# Patient Record
Sex: Female | Born: 1940 | Race: White | Hispanic: No | Smoking: Current every day smoker
Health system: Southern US, Community
[De-identification: ages and names within clinical notes are randomized; demographics above are authoritative.]

## PROBLEM LIST (undated history)

## (undated) DIAGNOSIS — K259 Gastric ulcer, unspecified as acute or chronic, without hemorrhage or perforation: Secondary | ICD-10-CM

## (undated) DIAGNOSIS — E079 Disorder of thyroid, unspecified: Secondary | ICD-10-CM

## (undated) DIAGNOSIS — I10 Essential (primary) hypertension: Secondary | ICD-10-CM

---

## 2004-12-25 ENCOUNTER — Ambulatory Visit: Payer: Self-pay | Admitting: Surgery

## 2005-07-21 ENCOUNTER — Emergency Department: Payer: Self-pay | Admitting: Emergency Medicine

## 2008-11-25 ENCOUNTER — Emergency Department: Payer: Self-pay | Admitting: Emergency Medicine

## 2009-06-15 ENCOUNTER — Emergency Department: Payer: Self-pay | Admitting: Emergency Medicine

## 2019-12-01 ENCOUNTER — Emergency Department: Payer: Medicare Other

## 2019-12-01 ENCOUNTER — Emergency Department
Admission: EM | Admit: 2019-12-01 | Discharge: 2019-12-01 | Disposition: A | Discharge status: Home / Self Care | Payer: Medicare Other | Attending: Emergency Medicine | Admitting: Emergency Medicine

## 2019-12-01 ENCOUNTER — Other Ambulatory Visit: Payer: Self-pay

## 2019-12-01 DIAGNOSIS — F17210 Nicotine dependence, cigarettes, uncomplicated: Secondary | ICD-10-CM | POA: Insufficient documentation

## 2019-12-01 DIAGNOSIS — I10 Essential (primary) hypertension: Secondary | ICD-10-CM | POA: Diagnosis not present

## 2019-12-01 DIAGNOSIS — K5792 Diverticulitis of intestine, part unspecified, without perforation or abscess without bleeding: Secondary | ICD-10-CM | POA: Insufficient documentation

## 2019-12-01 DIAGNOSIS — R1030 Lower abdominal pain, unspecified: Secondary | ICD-10-CM | POA: Diagnosis present

## 2019-12-01 HISTORY — DX: Gastric ulcer, unspecified as acute or chronic, without hemorrhage or perforation: K25.9

## 2019-12-01 HISTORY — DX: Disorder of thyroid, unspecified: E07.9

## 2019-12-01 HISTORY — DX: Essential (primary) hypertension: I10

## 2019-12-01 LAB — COMPREHENSIVE METABOLIC PANEL
ALT: 20 U/L (ref 0–44)
AST: 26 U/L (ref 15–41)
Albumin: 3.5 g/dL (ref 3.5–5.0)
Alkaline Phosphatase: 125 U/L (ref 38–126)
Anion gap: 10 (ref 5–15)
BUN: 21 mg/dL (ref 8–23)
CO2: 24 mmol/L (ref 22–32)
Calcium: 9.1 mg/dL (ref 8.9–10.3)
Chloride: 105 mmol/L (ref 98–111)
Creatinine, Ser: 1.5 mg/dL — ABNORMAL HIGH (ref 0.44–1.00)
GFR calc Af Amer: 39 mL/min — ABNORMAL LOW (ref >60)
GFR calc non Af Amer: 33 mL/min — ABNORMAL LOW (ref >60)
Glucose, Bld: 117 mg/dL — ABNORMAL HIGH (ref 70–99)
Potassium: 3.4 mmol/L — ABNORMAL LOW (ref 3.5–5.1)
Sodium: 139 mmol/L (ref 135–145)
Total Bilirubin: 0.5 mg/dL (ref 0.3–1.2)
Total Protein: 7.6 g/dL (ref 6.5–8.1)

## 2019-12-01 LAB — URINALYSIS, COMPLETE (UACMP) WITH MICROSCOPIC
Bacteria, UA: NONE SEEN
Bilirubin Urine: NEGATIVE
Glucose, UA: NEGATIVE mg/dL
Hgb urine dipstick: NEGATIVE
Ketones, ur: NEGATIVE mg/dL
Leukocytes,Ua: NEGATIVE
Nitrite: NEGATIVE
Protein, ur: 30 mg/dL — AB
Specific Gravity, Urine: 1.009 (ref 1.005–1.030)
pH: 6 (ref 5.0–8.0)

## 2019-12-01 LAB — CBC
HCT: 31.9 % — ABNORMAL LOW (ref 36.0–46.0)
Hemoglobin: 9.9 g/dL — ABNORMAL LOW (ref 12.0–15.0)
MCH: 25.3 pg — ABNORMAL LOW (ref 26.0–34.0)
MCHC: 31 g/dL (ref 30.0–36.0)
MCV: 81.6 fL (ref 80.0–100.0)
Platelets: 327 10*3/uL (ref 150–400)
RBC: 3.91 MIL/uL (ref 3.87–5.11)
RDW: 16.3 % — ABNORMAL HIGH (ref 11.5–15.5)
WBC: 16.7 10*3/uL — ABNORMAL HIGH (ref 4.0–10.5)
nRBC: 0 % (ref 0.0–0.2)

## 2019-12-01 LAB — TROPONIN I (HIGH SENSITIVITY): Troponin I (High Sensitivity): 10 ng/L (ref <18)

## 2019-12-01 LAB — LIPASE, BLOOD: Lipase: 41 U/L (ref 11–51)

## 2019-12-01 MED ORDER — POLYETHYLENE GLYCOL 3350 17 G PO PACK: 17.0000 g | PACK | Freq: Every day | ORAL | 0 refills | Status: AC

## 2019-12-01 MED ORDER — SODIUM CHLORIDE 0.9% FLUSH: 3.0000 mL | Freq: Once | INTRAVENOUS | Status: DC

## 2019-12-01 MED ORDER — TRAMADOL HCL 50 MG PO TABS: 50.0000 mg | ORAL_TABLET | Freq: Four times a day (QID) | ORAL | 0 refills | Status: AC | PRN

## 2019-12-01 MED ORDER — METRONIDAZOLE 500 MG PO TABS
500.0000 mg | ORAL_TABLET | Freq: Once | ORAL | Status: AC
  Administered 2019-12-01: 500 mg via ORAL
  Filled 2019-12-01: qty 1

## 2019-12-01 MED ORDER — LEVOFLOXACIN 750 MG PO TABS: 750.0000 mg | ORAL_TABLET | ORAL | 0 refills | Status: AC

## 2019-12-01 MED ORDER — TRAMADOL HCL 50 MG PO TABS
50.0000 mg | ORAL_TABLET | Freq: Once | ORAL | Status: AC
  Administered 2019-12-01: 50 mg via ORAL
  Filled 2019-12-01: qty 1

## 2019-12-01 MED ORDER — METRONIDAZOLE 500 MG PO TABS: 500.0000 mg | ORAL_TABLET | Freq: Two times a day (BID) | ORAL | 0 refills | Status: AC

## 2019-12-01 MED ORDER — IOHEXOL 300 MG/ML  SOLN: 75.0000 mL | Freq: Once | INTRAMUSCULAR | Status: DC | PRN

## 2019-12-01 MED ORDER — LEVOFLOXACIN 750 MG PO TABS
750.0000 mg | ORAL_TABLET | Freq: Once | ORAL | Status: AC
  Administered 2019-12-01: 750 mg via ORAL
  Filled 2019-12-01: qty 1

## 2019-12-01 NOTE — ED Notes (Signed)
Pt transported to CT ?

## 2019-12-01 NOTE — ED Triage Notes (Signed)
Pt to the er via ems for abd pain and bloating r/t constipation. Pt has been unable to have a BM in 3 days. Vitals with EMS were 164/86, 86, 97% on room air. Hx of gastric ulcer but not a blockage. Pt has tried 3 suppositories at home with no relief.

## 2019-12-01 NOTE — ED Provider Notes (Signed)
[facility] Emergency Department Provider Note   ____________________________________________    I have reviewed the triage vital signs and the nursing notes.   HISTORY  Chief Complaint Abdominal Pain     HPI Holly Brooks is a 78 y.o. female who presents with complaints of abdominal pain.  Patient reports symptoms developed yesterday evening.  She thought perhaps she was constipated and has tried to have bowel movements with little success and little improvement.  Some nausea no vomiting.  No history of abdominal surgery.  Has not taken anything for this.  Denies fevers or chills.  Denies dysuria.  Abdominal pain is in the lower abdomen and is mild to moderate   Past Medical History:  Diagnosis Date  . Gastric ulcer   . Hypertension   . Thyroid disease     There are no problems to display for this patient.     Prior to Admission medications   Medication Sig Start Date End Date Taking? Authorizing Provider  levofloxacin (LEVAQUIN) 750 MG tablet Take 1 tablet (750 mg total) by mouth every other day for 7 days. 12/03/19 12/10/19  Lavonia Drafts, MD  metroNIDAZOLE (FLAGYL) 500 MG tablet Take 1 tablet (500 mg total) by mouth 2 (two) times daily after a meal. 12/01/19   Lavonia Drafts, MD  polyethylene glycol (MIRALAX) 17 g packet Take 17 g by mouth daily. 12/01/19   Lavonia Drafts, MD  traMADol (ULTRAM) 50 MG tablet Take 1 tablet (50 mg total) by mouth every 6 (six) hours as needed. 12/01/19 11/30/20  Lavonia Drafts, MD     Allergies Patient has no allergy information on record.  No family history on file.  Social History Social History   Tobacco Use  . Smoking status: Current Every Day Smoker  . Smokeless tobacco: Never Used  Substance Use Topics  . Alcohol use: Never  . Drug use: Never    Review of Systems  Constitutional: No fever/chills Eyes: No visual changes.  ENT: No sore throat. Cardiovascular: Denies chest  pain. Respiratory: Denies shortness of breath. Gastrointestinal: As above Genitourinary: Negative for dysuria.  Some frequency Musculoskeletal: Negative for back pain. Skin: Negative for rash. Neurological: Negative for headaches    ____________________________________________   PHYSICAL EXAM:  VITAL SIGNS: ED Triage Vitals  Enc Vitals Group     BP 12/01/19 0533 (!) 154/58     Pulse Rate 12/01/19 0533 79     Resp 12/01/19 0533 18     Temp 12/01/19 0533 (!) 97.5 F (36.4 C)     Temp Source 12/01/19 0533 Oral     SpO2 12/01/19 0533 99 %     Weight 12/01/19 0534 52.2 kg (115 lb)     Height 12/01/19 0534 1.575 m (5\' 2" )     Head Circumference --      Peak Flow --      Pain Score 12/01/19 0534 7     Pain Loc --      Pain Edu? --      Excl. in [place]? --     Constitutional: Alert and oriented.   Nose: No congestion/rhinnorhea. Mouth/Throat: Mucous membranes are moist.    Cardiovascular: Normal rate, regular rhythm. Grossly normal heart sounds.  Good peripheral circulation. Respiratory: Normal respiratory effort.  No retractions. Lungs CTAB. Gastrointestinal: Mild tenderness palpation the left lower quadrant and suprapubically, no distention, no CVA tenderness  Musculoskeletal:  Warm and well perfused Neurologic:  Normal speech and language. No gross focal neurologic deficits  are appreciated.  Skin:  Skin is warm, dry and intact. No rash noted. Psychiatric: Mood and affect are normal. Speech and behavior are normal.  ____________________________________________   LABS (all labs ordered are listed, but only abnormal results are displayed)  Labs Reviewed  COMPREHENSIVE METABOLIC PANEL - Abnormal; Notable for the following components:      Result Value   Potassium 3.4 (*)    Glucose, Bld 117 (*)    Creatinine, Ser 1.50 (*)    GFR calc non Af Amer 33 (*)    GFR calc Af Amer 39 (*)    All other components within normal limits  CBC - Abnormal; Notable for the following  components:   WBC 16.7 (*)    Hemoglobin 9.9 (*)    HCT 31.9 (*)    MCH 25.3 (*)    RDW 16.3 (*)    All other components within normal limits  URINALYSIS, COMPLETE (UACMP) WITH MICROSCOPIC - Abnormal; Notable for the following components:   Color, Urine YELLOW (*)    APPearance CLEAR (*)    Protein, ur 30 (*)    All other components within normal limits  LIPASE, BLOOD  TROPONIN I (HIGH SENSITIVITY)   ____________________________________________  EKG  ED ECG REPORT I, Jene Every, the attending physician, personally viewed and interpreted this ECG.  Date: 12/01/2019  Rhythm: normal sinus rhythm QRS Axis: normal Intervals: normal ST/T Wave abnormalities: normal Narrative Interpretation: no evidence of acute ischemia  ____________________________________________  RADIOLOGY  CT abdomen pelvis ____________________________________________   PROCEDURES  Procedure(s) performed: No  Procedures   Critical Care performed: No ____________________________________________   INITIAL IMPRESSION / ASSESSMENT AND PLAN / ED COURSE  Pertinent labs & imaging results that were available during my care of the patient were reviewed by me and considered in my medical decision making (see chart for details).  Patient presents with primarily left lower quadrant and lower abdominal pain in the setting of constipation, mild to moderate tenderness to palpation.  Lab work significant for elevated white blood cell count.  KUB unremarkable, suspicious for diverticulitis, will obtain CT abdomen pelvis  CT abdomen pelvis suspicious for likely early diverticulitis, will start the patient on antibiotics, creatinine/kidney function accounted for in dosing.  Possible pneumonia seen on CT as well however patient without hypoxia will use Levaquin for diverticulitis and possible respiratory infection.  Return precautions discussed, outpatient follow-up, colonoscopy required      ____________________________________________   FINAL CLINICAL IMPRESSION(S) / ED DIAGNOSES  Final diagnoses:  Diverticulitis        Note:  This document was prepared using Dragon voice recognition software and may include unintentional dictation errors.   Jene Every, MD 12/01/19 1339

## 2019-12-01 NOTE — ED Notes (Signed)
Dr Kinner at bedside. 

## 2020-07-27 ENCOUNTER — Inpatient Hospital Stay: Payer: Medicare Other | Admitting: Oncology

## 2020-07-27 ENCOUNTER — Inpatient Hospital Stay: Payer: Medicare Other

## 2020-07-30 ENCOUNTER — Other Ambulatory Visit: Payer: Self-pay | Admitting: Infectious Diseases

## 2020-07-30 DIAGNOSIS — R413 Other amnesia: Secondary | ICD-10-CM

## 2020-07-30 DIAGNOSIS — R270 Ataxia, unspecified: Secondary | ICD-10-CM

## 2020-08-01 ENCOUNTER — Other Ambulatory Visit: Payer: Self-pay

## 2020-08-01 DIAGNOSIS — Z5321 Procedure and treatment not carried out due to patient leaving prior to being seen by health care provider: Secondary | ICD-10-CM | POA: Diagnosis not present

## 2020-08-01 DIAGNOSIS — R109 Unspecified abdominal pain: Secondary | ICD-10-CM | POA: Diagnosis not present

## 2020-08-01 LAB — COMPREHENSIVE METABOLIC PANEL
ALT: 12 U/L (ref 0–44)
AST: 16 U/L (ref 15–41)
Albumin: 3.5 g/dL (ref 3.5–5.0)
Alkaline Phosphatase: 79 U/L (ref 38–126)
Anion gap: 13 (ref 5–15)
BUN: 24 mg/dL — ABNORMAL HIGH (ref 8–23)
CO2: 23 mmol/L (ref 22–32)
Calcium: 9.2 mg/dL (ref 8.9–10.3)
Chloride: 90 mmol/L — ABNORMAL LOW (ref 98–111)
Creatinine, Ser: 1.4 mg/dL — ABNORMAL HIGH (ref 0.44–1.00)
GFR calc Af Amer: 42 mL/min — ABNORMAL LOW (ref >60)
GFR calc non Af Amer: 36 mL/min — ABNORMAL LOW (ref >60)
Glucose, Bld: 96 mg/dL (ref 70–99)
Potassium: 4 mmol/L (ref 3.5–5.1)
Sodium: 126 mmol/L — ABNORMAL LOW (ref 135–145)
Total Bilirubin: 0.5 mg/dL (ref 0.3–1.2)
Total Protein: 7.2 g/dL (ref 6.5–8.1)

## 2020-08-01 LAB — CBC
HCT: 26.8 % — ABNORMAL LOW (ref 36.0–46.0)
Hemoglobin: 9.1 g/dL — ABNORMAL LOW (ref 12.0–15.0)
MCH: 27.6 pg (ref 26.0–34.0)
MCHC: 34 g/dL (ref 30.0–36.0)
MCV: 81.2 fL (ref 80.0–100.0)
Platelets: 308 10*3/uL (ref 150–400)
RBC: 3.3 MIL/uL — ABNORMAL LOW (ref 3.87–5.11)
RDW: 13 % (ref 11.5–15.5)
WBC: 11.5 10*3/uL — ABNORMAL HIGH (ref 4.0–10.5)
nRBC: 0 % (ref 0.0–0.2)

## 2020-08-01 LAB — URINALYSIS, COMPLETE (UACMP) WITH MICROSCOPIC
Bacteria, UA: NONE SEEN
Bilirubin Urine: NEGATIVE
Glucose, UA: NEGATIVE mg/dL
Hgb urine dipstick: NEGATIVE
Ketones, ur: NEGATIVE mg/dL
Leukocytes,Ua: NEGATIVE
Nitrite: NEGATIVE
Protein, ur: NEGATIVE mg/dL
Specific Gravity, Urine: 1.003 — ABNORMAL LOW (ref 1.005–1.030)
pH: 7 (ref 5.0–8.0)

## 2020-08-01 LAB — LIPASE, BLOOD: Lipase: 67 U/L — ABNORMAL HIGH (ref 11–51)

## 2020-08-01 NOTE — ED Triage Notes (Signed)
Pt comes for abd pain for a few days. Hx of diverticulitis. States pain is the same.

## 2020-08-02 ENCOUNTER — Emergency Department
Admission: EM | Admit: 2020-08-02 | Discharge: 2020-08-02 | Disposition: A | Discharge status: Home / Self Care | Payer: Medicare Other | Attending: Emergency Medicine | Admitting: Emergency Medicine

## 2020-08-03 ENCOUNTER — Emergency Department
Admission: EM | Admit: 2020-08-03 | Discharge: 2020-08-03 | Disposition: A | Discharge status: Home / Self Care | Payer: Medicare Other | Attending: Emergency Medicine | Admitting: Emergency Medicine

## 2020-08-03 ENCOUNTER — Other Ambulatory Visit: Payer: Self-pay

## 2020-08-03 ENCOUNTER — Emergency Department: Payer: Medicare Other

## 2020-08-03 DIAGNOSIS — Y998 Other external cause status: Secondary | ICD-10-CM | POA: Insufficient documentation

## 2020-08-03 DIAGNOSIS — X58XXXA Exposure to other specified factors, initial encounter: Secondary | ICD-10-CM | POA: Diagnosis not present

## 2020-08-03 DIAGNOSIS — Y929 Unspecified place or not applicable: Secondary | ICD-10-CM | POA: Diagnosis not present

## 2020-08-03 DIAGNOSIS — S0990XA Unspecified injury of head, initial encounter: Secondary | ICD-10-CM | POA: Diagnosis present

## 2020-08-03 DIAGNOSIS — Y939 Activity, unspecified: Secondary | ICD-10-CM | POA: Insufficient documentation

## 2020-08-03 DIAGNOSIS — Z5321 Procedure and treatment not carried out due to patient leaving prior to being seen by health care provider: Secondary | ICD-10-CM | POA: Diagnosis not present

## 2020-08-03 DIAGNOSIS — S0101XA Laceration without foreign body of scalp, initial encounter: Secondary | ICD-10-CM | POA: Diagnosis not present

## 2020-08-03 LAB — BASIC METABOLIC PANEL
Anion gap: 12 (ref 5–15)
BUN: 23 mg/dL (ref 8–23)
CO2: 21 mmol/L — ABNORMAL LOW (ref 22–32)
Calcium: 8.2 mg/dL — ABNORMAL LOW (ref 8.9–10.3)
Chloride: 89 mmol/L — ABNORMAL LOW (ref 98–111)
Creatinine, Ser: 1.43 mg/dL — ABNORMAL HIGH (ref 0.44–1.00)
GFR calc Af Amer: 41 mL/min — ABNORMAL LOW (ref >60)
GFR calc non Af Amer: 35 mL/min — ABNORMAL LOW (ref >60)
Glucose, Bld: 145 mg/dL — ABNORMAL HIGH (ref 70–99)
Potassium: 2.8 mmol/L — ABNORMAL LOW (ref 3.5–5.1)
Sodium: 122 mmol/L — ABNORMAL LOW (ref 135–145)

## 2020-08-03 LAB — CBC
HCT: 24.5 % — ABNORMAL LOW (ref 36.0–46.0)
Hemoglobin: 8.8 g/dL — ABNORMAL LOW (ref 12.0–15.0)
MCH: 27.8 pg (ref 26.0–34.0)
MCHC: 35.9 g/dL (ref 30.0–36.0)
MCV: 77.3 fL — ABNORMAL LOW (ref 80.0–100.0)
Platelets: 340 10*3/uL (ref 150–400)
RBC: 3.17 MIL/uL — ABNORMAL LOW (ref 3.87–5.11)
RDW: 13 % (ref 11.5–15.5)
WBC: 13.8 10*3/uL — ABNORMAL HIGH (ref 4.0–10.5)
nRBC: 0 % (ref 0.0–0.2)

## 2020-08-03 NOTE — ED Triage Notes (Signed)
Pt to ED via EMS for fall this AM. Reports hitting head, denies blood thinner use.  Knot noted to back of head with laceration, bleeding controlled at this time.  Pt in ccollar with Ems. Denies neck pain  Clear speech

## 2022-04-05 IMAGING — CT CT CERVICAL SPINE W/O CM
3 of 4 series · 12 of 33 positions shown, 14 images · non-contrast
Comparison: July 21, 2005.

CLINICAL DATA: Head laceration after fall.

EXAM:
CT HEAD WITHOUT CONTRAST
CT CERVICAL SPINE WITHOUT CONTRAST
TECHNIQUE: Multidetector CT imaging of the head and cervical spine was
performed following the standard protocol without intravenous
contrast. Multiplanar CT image reconstructions of the cervical spine
were also generated.

[Series 6: sagittal bone · sagittal · 0.21mm/px · 5 of 54 slices shown, 6 images]
[im 18/54  bone]
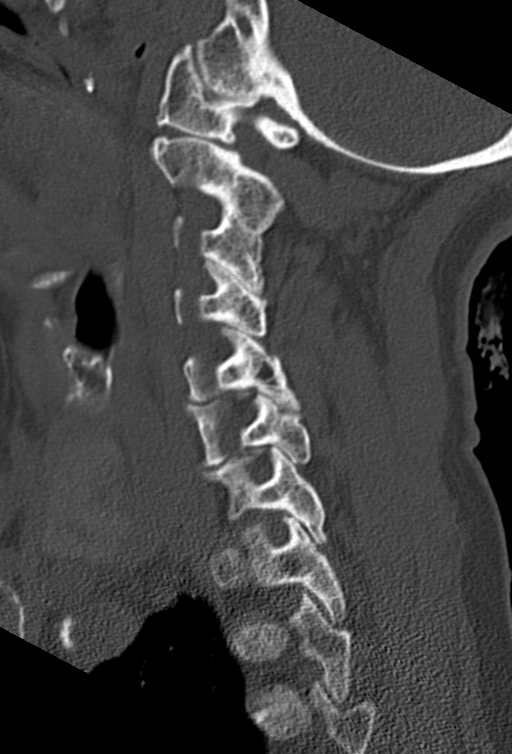
[im 23/54  bone]
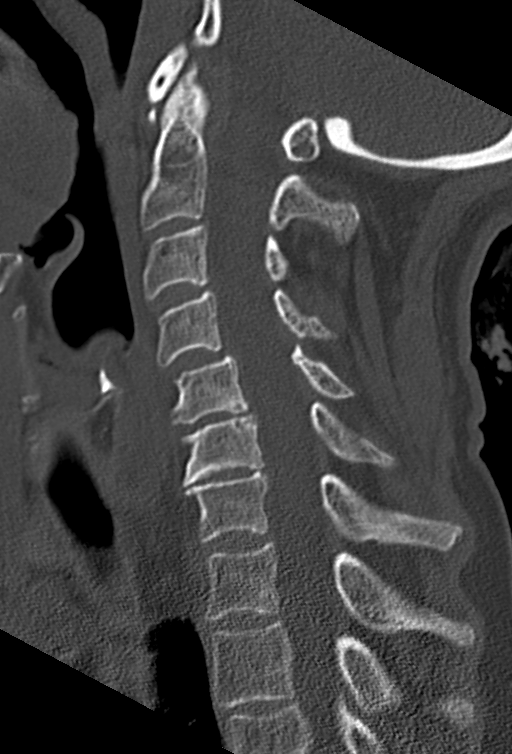
[im 27/54  soft-tissue]
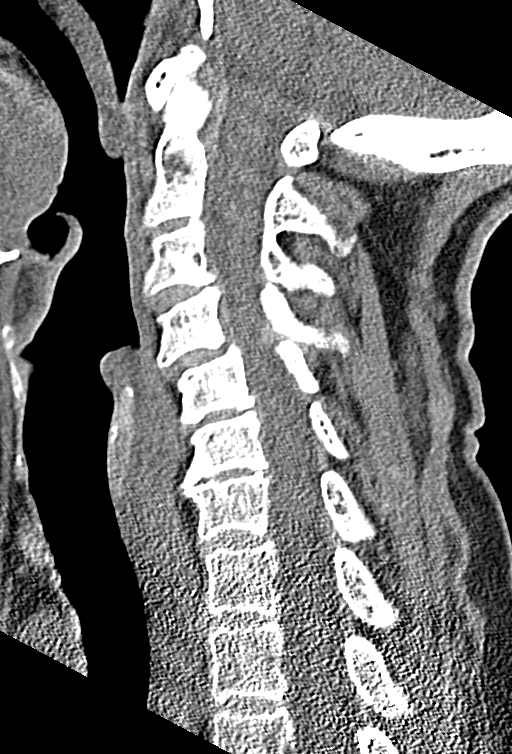
[im 27/54  bone]
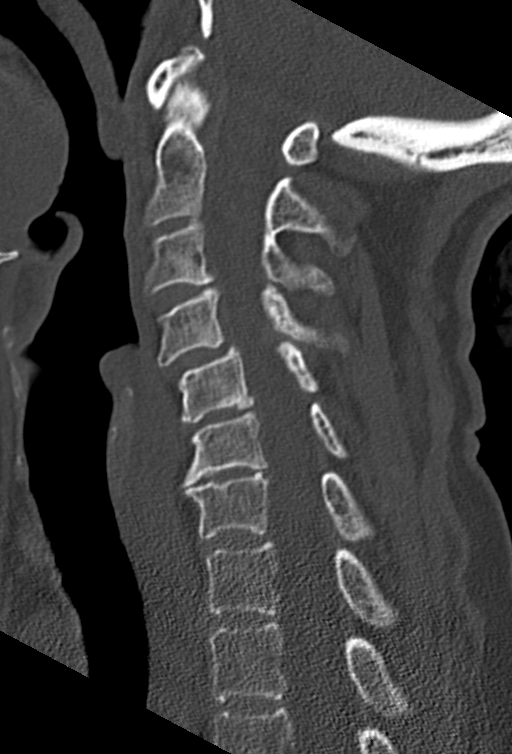
[im 31/54  bone]
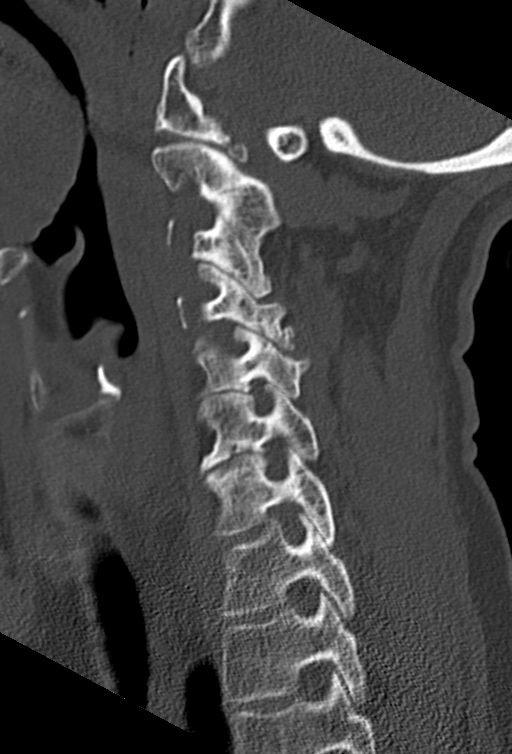
[im 36/54  bone]
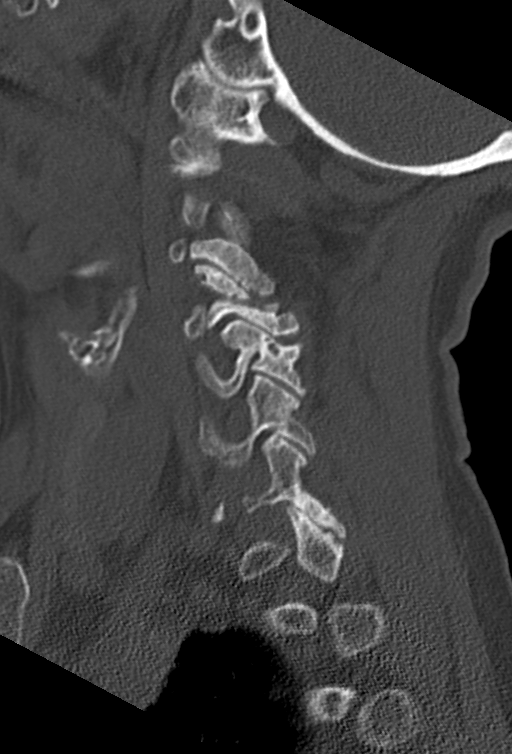

[Series 7: coronal bone · coronal · 0.21mm/px · 3 of 55 slices shown]
[im 12/55  bone]
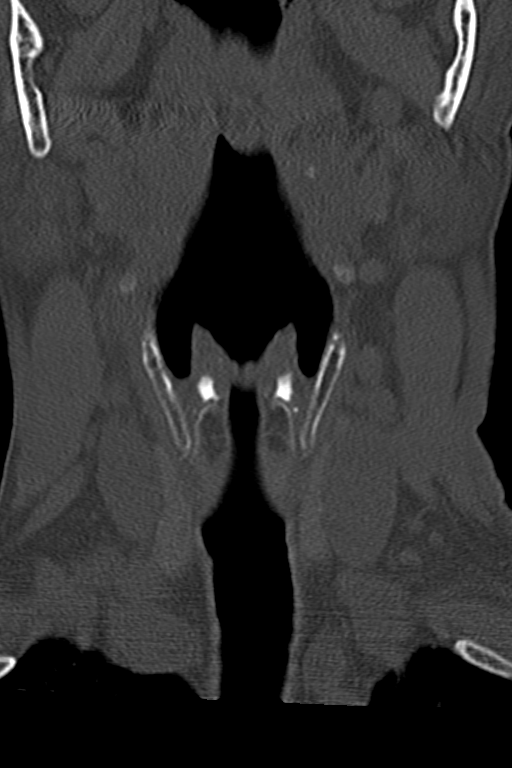
[im 22/55  bone]
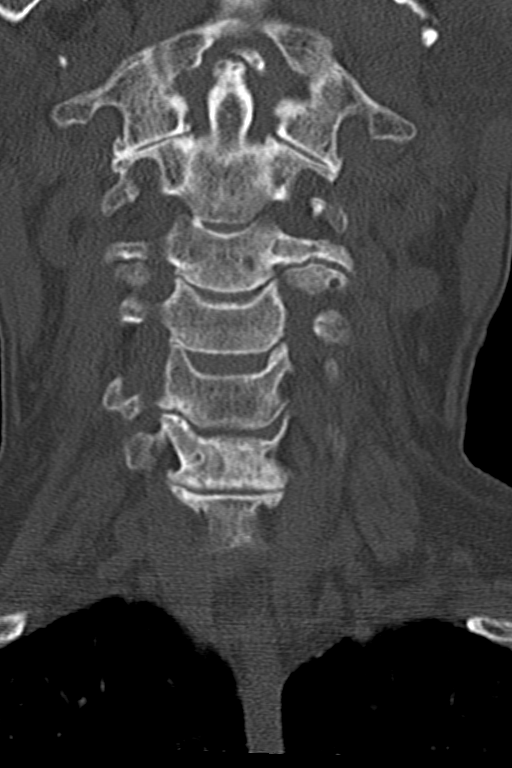
[im 33/55  bone]
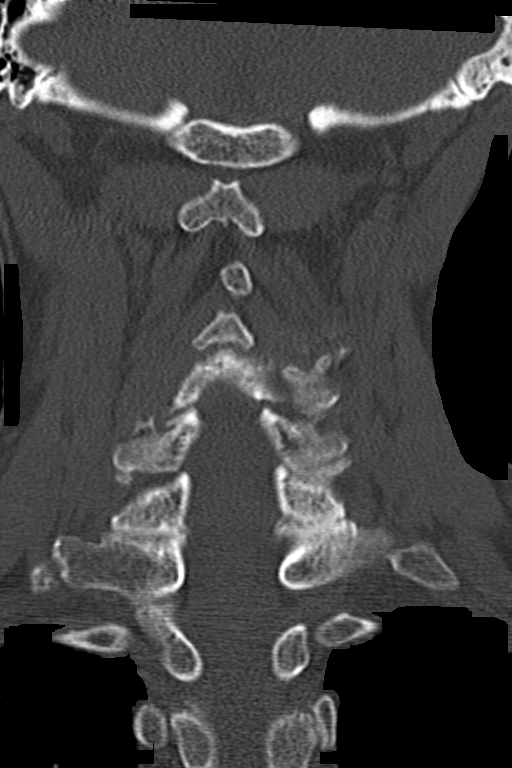

[Series 8: orthogonal bone · axial · 0.21mm/px · z∈[+198,+290]mm · 4 of 80 slices shown, 5 images]
[im 14/80  soft-tissue]
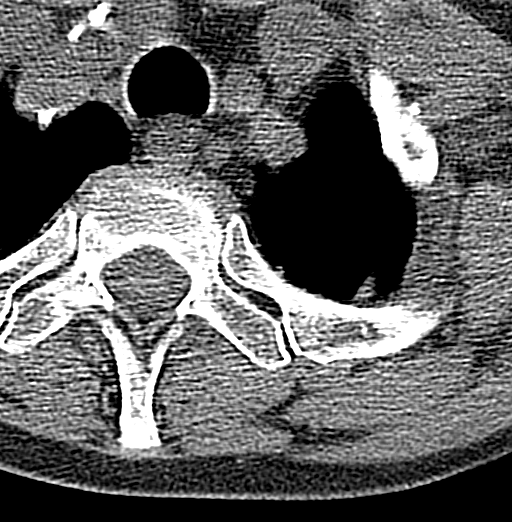
[im 14/80  bone]
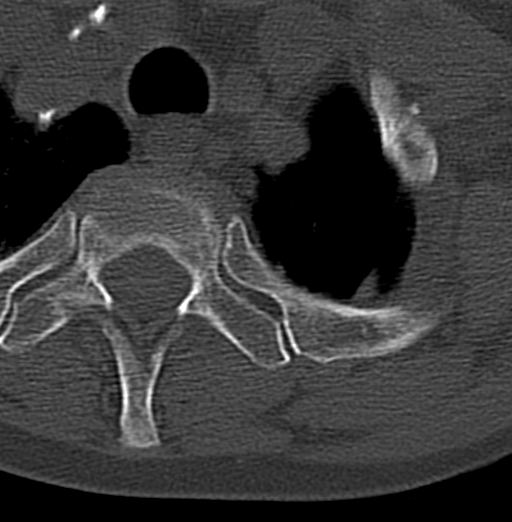
[im 27/80  bone]
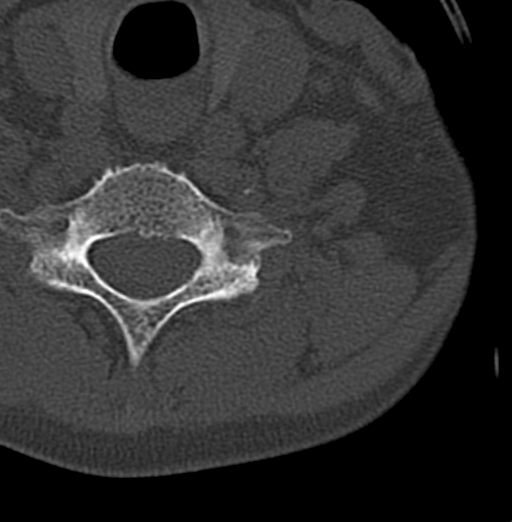
[im 53/80  bone]
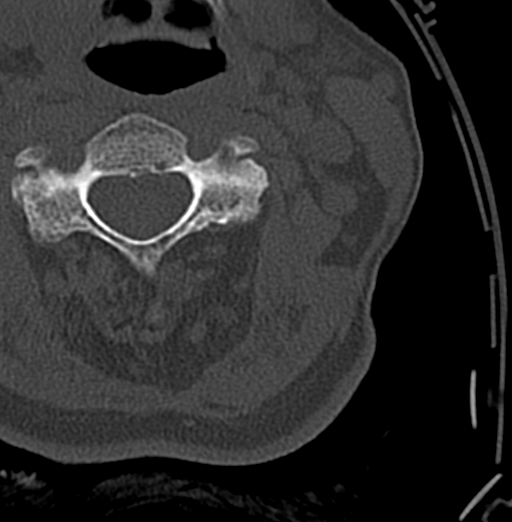
[im 66/80  bone]
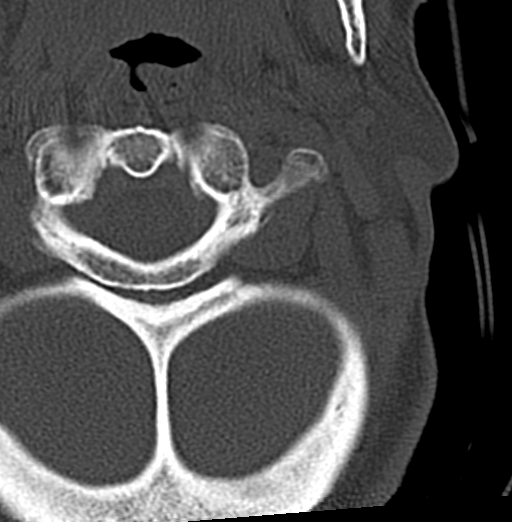

[12 of 33 positions shown; findings below may reference images not displayed]

FINDINGS: CT HEAD FINDINGS

Brain: Mild chronic ischemic white matter disease is noted. No mass
effect or midline shift is noted. Ventricular size is within normal
limits. There is no evidence of mass lesion, hemorrhage or acute
infarction.

Vascular: No hyperdense vessel or unexpected calcification.

Skull: Normal. Negative for fracture or focal lesion.

Sinuses/Orbits: No acute finding.

Other: Small scalp hematoma is seen posteriorly.

CT CERVICAL SPINE FINDINGS

Alignment: Grade 1 anterolisthesis of C4-5 is noted secondary to
posterior facet joint hypertrophy. Minimal grade 1 anterolisthesis
is noted at C5-6 secondary to posterior facet joint hypertrophy.

Skull base and vertebrae: No acute fracture. No primary bone lesion
or focal pathologic process.

Soft tissues and spinal canal: No prevertebral fluid or swelling. No
visible canal hematoma.

Disc levels: Moderate degenerative disc disease is noted at C5-6 and
C6-7.

Upper chest: Negative.

Other: Degenerative changes are seen involving the posterior facet
joints bilaterally.
IMPRESSION: 1. Mild chronic ischemic white matter disease. Small scalp hematoma
posteriorly. No acute intracranial abnormality seen.
2. Multilevel degenerative disc disease. No acute abnormality seen
in the cervical spine.

## 2022-04-05 IMAGING — CT CT HEAD W/O CM
3 series · 15 of 45 positions shown, 18 images · non-contrast
Comparison: July 21, 2005.

CLINICAL DATA: Head laceration after fall.

EXAM:
CT HEAD WITHOUT CONTRAST
CT CERVICAL SPINE WITHOUT CONTRAST
TECHNIQUE: Multidetector CT imaging of the head and cervical spine was
performed following the standard protocol without intravenous
contrast. Multiplanar CT image reconstructions of the cervical spine
were also generated.

[Series 2: head wo · axial · 0.39mm/px · z∈[+345,+460]mm · 9 of 28 slices shown, 12 images]
[im 3/28  brain]
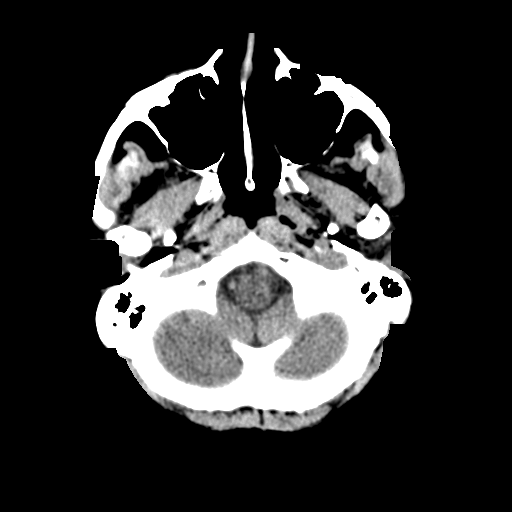
[im 3/28  bone]
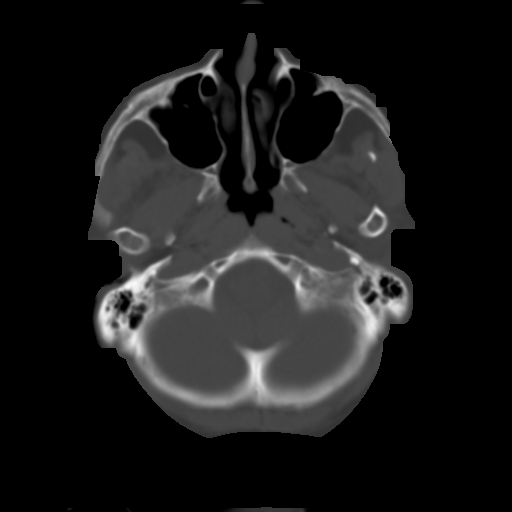
[im 6/28  brain]
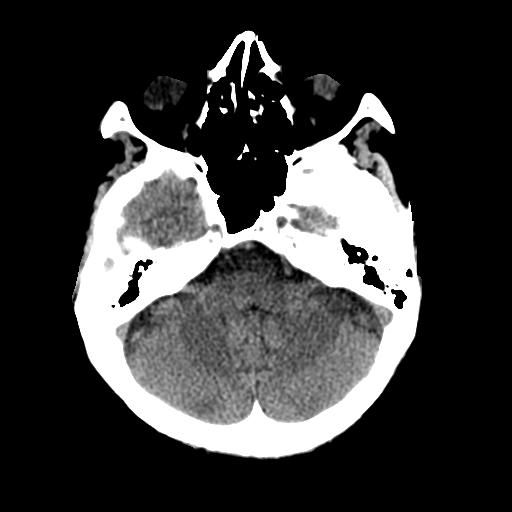
[im 9/28  brain]
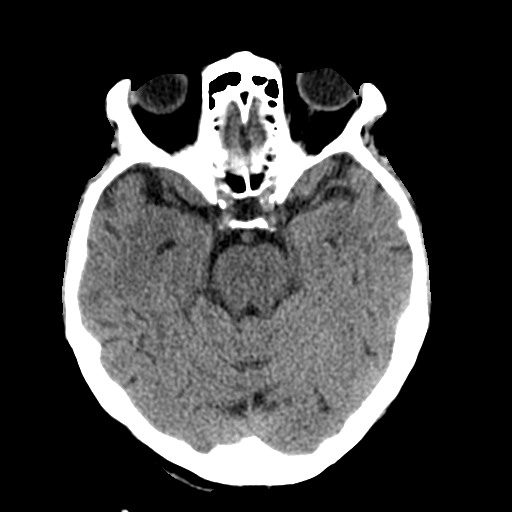
[im 12/28  brain]
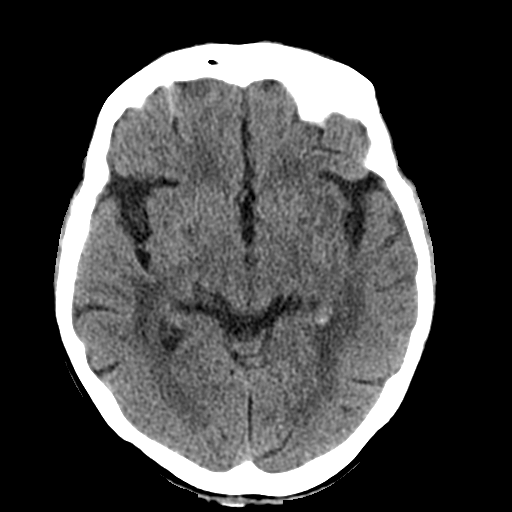
[im 15/28  brain]
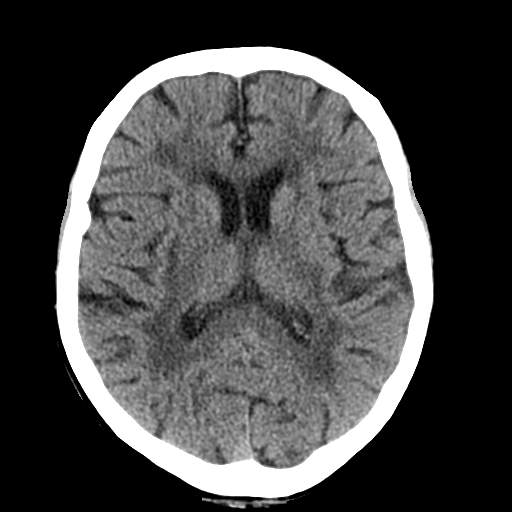
[im 15/28  bone]
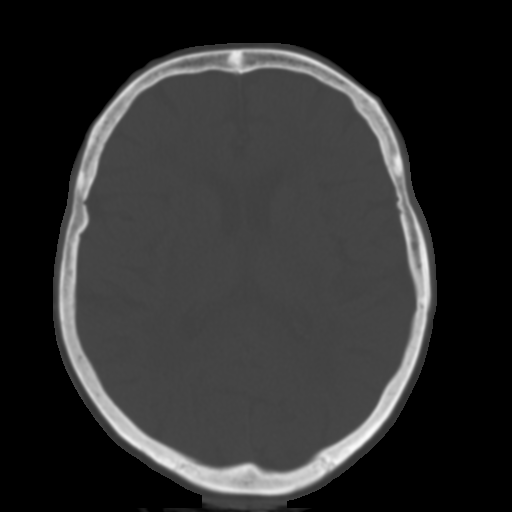
[im 17/28  brain]
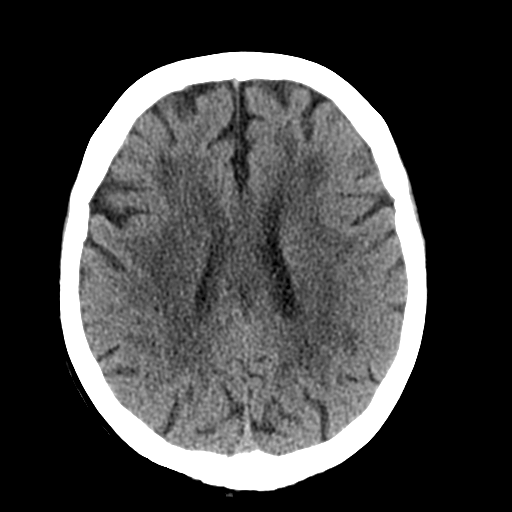
[im 20/28  brain]
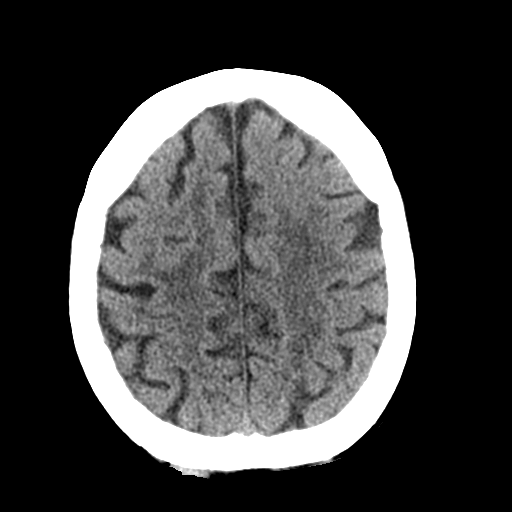
[im 23/28  brain]
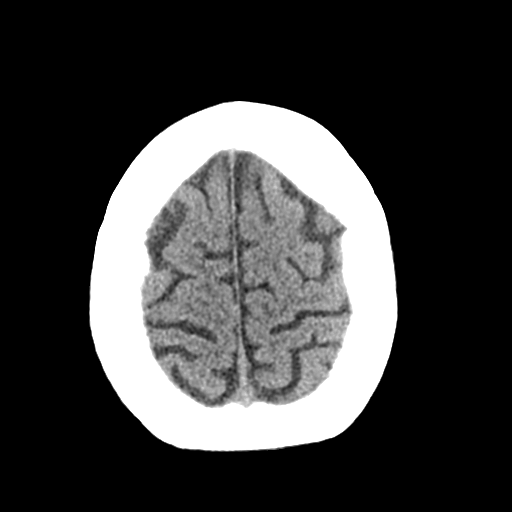
[im 26/28  brain]
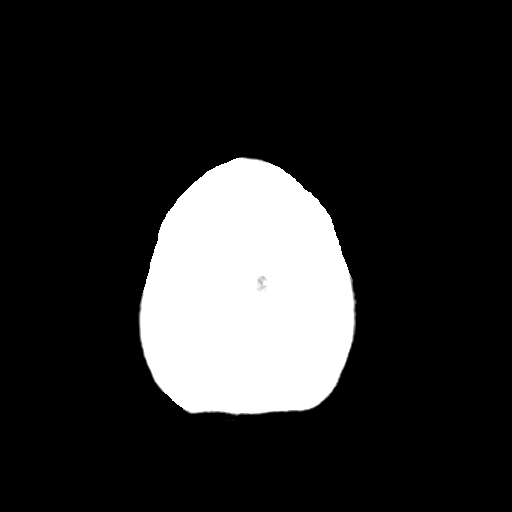
[im 26/28  bone]
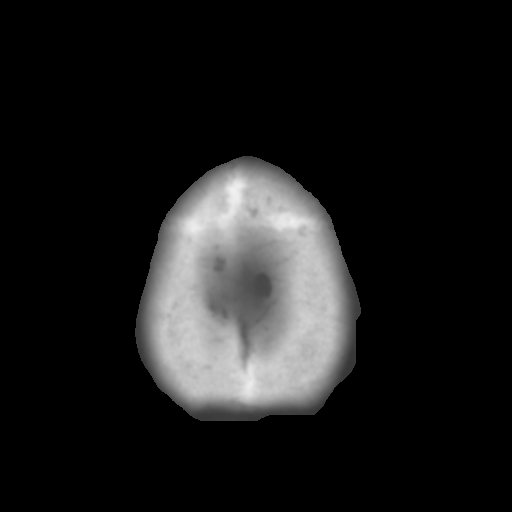

[Series 4: coronal soft tissue · coronal · 0.29mm/px · 3 of 65 slices shown]
[im 22/65  brain]
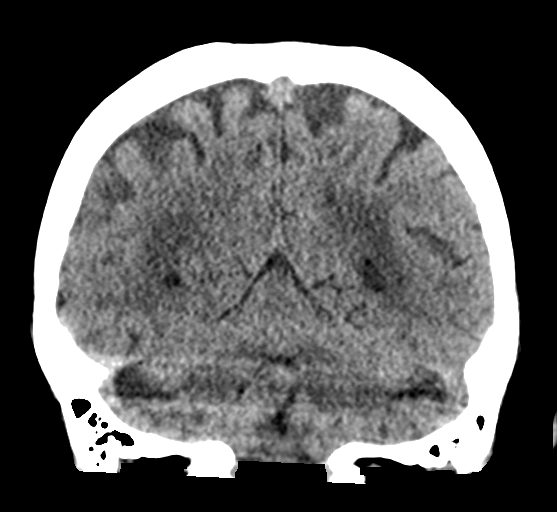
[im 29/65  brain]
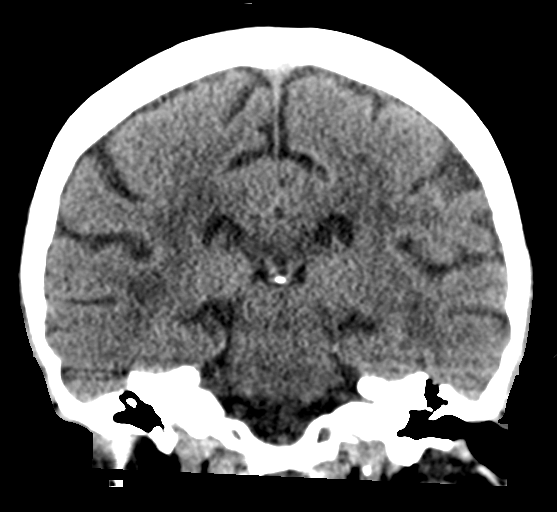
[im 36/65  brain]
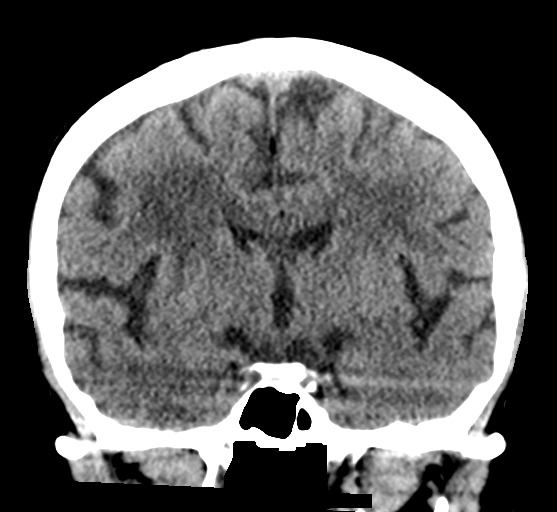

[Series 5: sagittal soft tissue · sagittal · 0.28mm/px · 3 of 55 slices shown]
[im 19/55  brain]
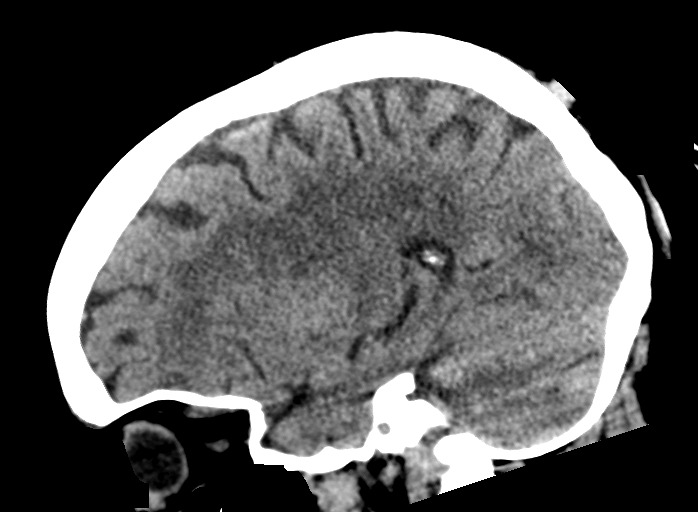
[im 28/55  brain]
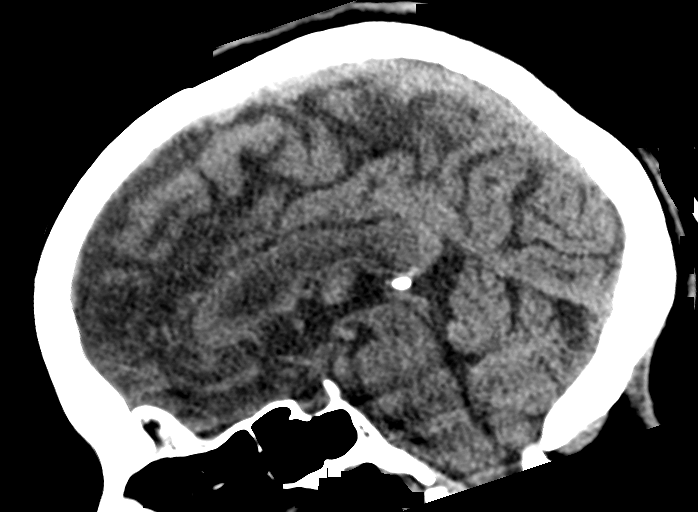
[im 37/55  brain]
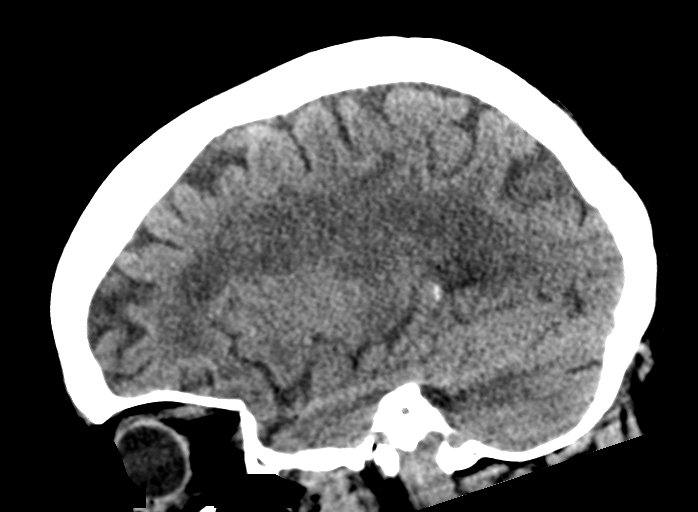

[15 of 45 positions shown; findings below may reference images not displayed]

FINDINGS: CT HEAD FINDINGS

Brain: Mild chronic ischemic white matter disease is noted. No mass
effect or midline shift is noted. Ventricular size is within normal
limits. There is no evidence of mass lesion, hemorrhage or acute
infarction.

Vascular: No hyperdense vessel or unexpected calcification.

Skull: Normal. Negative for fracture or focal lesion.

Sinuses/Orbits: No acute finding.

Other: Small scalp hematoma is seen posteriorly.

CT CERVICAL SPINE FINDINGS

Alignment: Grade 1 anterolisthesis of C4-5 is noted secondary to
posterior facet joint hypertrophy. Minimal grade 1 anterolisthesis
is noted at C5-6 secondary to posterior facet joint hypertrophy.

Skull base and vertebrae: No acute fracture. No primary bone lesion
or focal pathologic process.

Soft tissues and spinal canal: No prevertebral fluid or swelling. No
visible canal hematoma.

Disc levels: Moderate degenerative disc disease is noted at C5-6 and
C6-7.

Upper chest: Negative.

Other: Degenerative changes are seen involving the posterior facet
joints bilaterally.
IMPRESSION: 1. Mild chronic ischemic white matter disease. Small scalp hematoma
posteriorly. No acute intracranial abnormality seen.
2. Multilevel degenerative disc disease. No acute abnormality seen
in the cervical spine.

## 2023-07-10 ENCOUNTER — Other Ambulatory Visit: Payer: Self-pay | Admitting: Family Medicine

## 2023-07-10 DIAGNOSIS — N184 Chronic kidney disease, stage 4 (severe): Secondary | ICD-10-CM

## 2023-07-15 ENCOUNTER — Ambulatory Visit
Admission: RE | Admit: 2023-07-15 | Discharge: 2023-07-15 | Disposition: A | Discharge status: Home / Self Care | Payer: 59 | Source: Ambulatory Visit | Attending: Family Medicine | Admitting: Family Medicine

## 2023-07-15 DIAGNOSIS — N184 Chronic kidney disease, stage 4 (severe): Secondary | ICD-10-CM | POA: Diagnosis not present

## 2023-08-10 ENCOUNTER — Other Ambulatory Visit: Payer: Self-pay | Admitting: Nephrology

## 2023-08-10 DIAGNOSIS — N184 Chronic kidney disease, stage 4 (severe): Secondary | ICD-10-CM

## 2023-08-10 DIAGNOSIS — D631 Anemia in chronic kidney disease: Secondary | ICD-10-CM

## 2023-08-17 ENCOUNTER — Other Ambulatory Visit: Payer: Self-pay | Admitting: Family Medicine

## 2023-08-17 DIAGNOSIS — R2242 Localized swelling, mass and lump, left lower limb: Secondary | ICD-10-CM

## 2023-09-10 ENCOUNTER — Other Ambulatory Visit: Payer: Self-pay | Admitting: Nephrology

## 2023-09-10 DIAGNOSIS — R1909 Other intra-abdominal and pelvic swelling, mass and lump: Secondary | ICD-10-CM

## 2023-09-15 ENCOUNTER — Ambulatory Visit: Payer: 59

## 2023-09-16 ENCOUNTER — Encounter: Payer: Self-pay | Admitting: Nephrology

## 2023-09-17 ENCOUNTER — Emergency Department
Admission: EM | Admit: 2023-09-17 | Discharge: 2023-09-17 | Disposition: A | Discharge status: Home / Self Care | Payer: 59 | Attending: Emergency Medicine | Admitting: Emergency Medicine

## 2023-09-17 DIAGNOSIS — T40721A Poisoning by synthetic cannabinoids, accidental (unintentional), initial encounter: Secondary | ICD-10-CM | POA: Insufficient documentation

## 2023-09-17 DIAGNOSIS — T50901A Poisoning by unspecified drugs, medicaments and biological substances, accidental (unintentional), initial encounter: Secondary | ICD-10-CM

## 2023-09-17 LAB — COMPREHENSIVE METABOLIC PANEL
ALT: 8 U/L (ref 0–44)
AST: 13 U/L — ABNORMAL LOW (ref 15–41)
Albumin: 3 g/dL — ABNORMAL LOW (ref 3.5–5.0)
Alkaline Phosphatase: 125 U/L (ref 38–126)
Anion gap: 14 (ref 5–15)
BUN: 22 mg/dL (ref 8–23)
CO2: 21 mmol/L — ABNORMAL LOW (ref 22–32)
Calcium: 8.7 mg/dL — ABNORMAL LOW (ref 8.9–10.3)
Chloride: 103 mmol/L (ref 98–111)
Creatinine, Ser: 1.88 mg/dL — ABNORMAL HIGH (ref 0.44–1.00)
GFR, Estimated: 27 mL/min — ABNORMAL LOW (ref >60)
Glucose, Bld: 147 mg/dL — ABNORMAL HIGH (ref 70–99)
Potassium: 4.1 mmol/L (ref 3.5–5.1)
Sodium: 138 mmol/L (ref 135–145)
Total Bilirubin: 0.5 mg/dL (ref 0.3–1.2)
Total Protein: 7.1 g/dL (ref 6.5–8.1)

## 2023-09-17 LAB — CBC
HCT: 29.7 % — ABNORMAL LOW (ref 36.0–46.0)
Hemoglobin: 9.3 g/dL — ABNORMAL LOW (ref 12.0–15.0)
MCH: 23.7 pg — ABNORMAL LOW (ref 26.0–34.0)
MCHC: 31.3 g/dL (ref 30.0–36.0)
MCV: 75.6 fL — ABNORMAL LOW (ref 80.0–100.0)
Platelets: 407 10*3/uL — ABNORMAL HIGH (ref 150–400)
RBC: 3.93 MIL/uL (ref 3.87–5.11)
RDW: 15.1 % (ref 11.5–15.5)
WBC: 8.2 10*3/uL (ref 4.0–10.5)
nRBC: 0 % (ref 0.0–0.2)

## 2023-09-17 LAB — URINALYSIS, ROUTINE W REFLEX MICROSCOPIC
Bacteria, UA: NONE SEEN
Bilirubin Urine: NEGATIVE
Glucose, UA: NEGATIVE mg/dL
Hgb urine dipstick: NEGATIVE
Ketones, ur: NEGATIVE mg/dL
Nitrite: NEGATIVE
Protein, ur: 30 mg/dL — AB
Specific Gravity, Urine: 1.015 (ref 1.005–1.030)
pH: 7 (ref 5.0–8.0)

## 2023-09-17 LAB — LIPASE, BLOOD: Lipase: 32 U/L (ref 11–51)

## 2023-09-17 MED ORDER — ONDANSETRON 4 MG PO TBDP
4.0000 mg | ORAL_TABLET | Freq: Once | ORAL | Status: AC | PRN
  Administered 2023-09-17: 4 mg via ORAL
  Filled 2023-09-17: qty 1

## 2023-09-17 NOTE — ED Provider Notes (Signed)
[facility] Provider Note    Event Date/Time   First MD Initiated Contact with Patient 09/17/23 1850     (approximate)   History   Drug Overdose   HPI  Holly Brooks is a 82 y.o. female presents to the urgency department today after accidental ingestion of delta 8, delta 9 Gummies.  A family member gave them to her thinking they were candy.  The patient states that she took 2 Gummies.  This did occur roughly 3 to 4 hours prior to my evaluation. Patient states that she feels cold, is hungry. Family that is with patient states that she is not completely back to baseline but is a lot calmer and more lucid then she was earlier after the ingestion.      Physical Exam   Triage Vital Signs: ED Triage Vitals  Encounter Vitals Group     BP 09/17/23 1735 (!) 170/76     Systolic BP Percentile --      Diastolic BP Percentile --      Pulse Rate 09/17/23 1735 75     Resp 09/17/23 1735 14     Temp 09/17/23 1735 (!) 97.5 F (36.4 C)     Temp Source 09/17/23 1735 Axillary     SpO2 09/17/23 1735 91 %     Weight 09/17/23 1733 102 lb (46.3 kg)     Height 09/17/23 1733 5\' 2"  (1.575 m)     Head Circumference --      Peak Flow --      Pain Score 09/17/23 1733 0     Pain Loc --      Pain Education --      Exclude from Growth Chart --     Most recent vital signs: Vitals:   09/17/23 1735 09/17/23 1900  BP: (!) 170/76 (!) 164/58  Pulse: 75 73  Resp: 14 18  Temp: (!) 97.5 F (36.4 C)   SpO2: 91% 93%   General: Awake, alert, oriented. CV:  Good peripheral perfusion. Regular rate and rhythm. Resp:  Normal effort. Lungs clear. Abd:  No distention.   ED Results / Procedures / Treatments   Labs (all labs ordered are listed, but only abnormal results are displayed) Labs Reviewed  COMPREHENSIVE METABOLIC PANEL - Abnormal; Notable for the following components:      Result Value   CO2 21 (*)    Glucose, Bld 147 (*)    Creatinine, Ser 1.88 (*)    Calcium  8.7 (*)    Albumin 3.0 (*)    AST 13 (*)    GFR, Estimated 27 (*)    All other components within normal limits  CBC - Abnormal; Notable for the following components:   Hemoglobin 9.3 (*)    HCT 29.7 (*)    MCV 75.6 (*)    MCH 23.7 (*)    Platelets 407 (*)    All other components within normal limits  LIPASE, BLOOD  URINALYSIS, ROUTINE W REFLEX MICROSCOPIC     EKG  I, Phineas Semen, attending physician, personally viewed and interpreted this EKG  EKG Time: 1739 Rate: 73 Rhythm: normal sinus rhythm Axis: normal Intervals: qtc 407 QRS: narrow ST changes: no st elevation Impression: normal ekg    RADIOLOGY None   PROCEDURES:  Critical Care performed: No   MEDICATIONS ORDERED IN ED: Medications  ondansetron (ZOFRAN-ODT) disintegrating tablet 4 mg (4 mg Oral Given 09/17/23 1744)     IMPRESSION / MDM / ASSESSMENT AND PLAN /  ED COURSE  I reviewed the triage vital signs and the nursing notes.                              Differential diagnosis includes, but is not limited to, accidental ingestion  Patient's presentation is most consistent with acute presentation with potential threat to life or bodily function.   The patient is on the cardiac monitor to evaluate for evidence of arrhythmia and/or significant heart rate changes.  Patient presented to the emergency department today after an accidental ingestion of delta 8/9.  On my exam patient is awake and alert.  Appears oriented.  Poison control had been called and recommended a 6-hour observation.  I discussed this with the family.  Do think it is okay for patient to eat or drink.  Will continue to monitor.  Patient was observed in the emergency department for a number of hours with continued improvement.  She was able to tolerate p.o.  At this time I think would be reasonable for patient to be discharged home.     FINAL CLINICAL IMPRESSION(S) / ED DIAGNOSES   Final diagnoses:  Accidental overdose,  initial encounter      Note:  This document was prepared using Dragon voice recognition software and may include unintentional dictation errors.    Phineas Semen, MD 09/17/23 2234

## 2023-09-17 NOTE — ED Notes (Signed)
Patient was able to independently transfer from wheelchair to stretcher with stand-by assist. Son is at bedside. Patient was given warm blanket and socks for c/o cold. Son states patient keeps her house at 80 degrees.

## 2023-09-17 NOTE — ED Notes (Signed)
Patient discharged from ED by provider. Discharge instructions reviewed with patient and family member and all questions answered. Patient wheeled from ED in NAD.

## 2023-09-17 NOTE — ED Notes (Signed)
Assisted pt to wheelchair so we could obtain a urine. Pt ambulated from toilet to w/c without any help. Urine sent to lab.

## 2023-09-17 NOTE — ED Triage Notes (Signed)
Pt arrives via [facility] from home after ingesting 20 mg of Delta 9 gummies. Pt was given these without her knowledge of what they were by a family member. Pt dry heaving during triage. EMS reports pt had increased paranoia and feelings of doom.   [place] Poison Control notified during triage. Advised effects can last up to 24 hours and observation of six hours or until pt is at baseline. Advised to monitor for bradycardia, tachycardia, seizures. Encourage PO fluids and monitor mental status. Supportive care of fluids.

## 2023-09-19 NOTE — Plan of Care (Signed)
[facility] Tonsillectomy/Adenoidectomy, Postoperative PEDS care plan entered in error.

## 2023-09-21 ENCOUNTER — Ambulatory Visit
Admission: RE | Admit: 2023-09-21 | Discharge: 2023-09-21 | Disposition: A | Discharge status: Home / Self Care | Payer: 59 | Source: Ambulatory Visit | Attending: Nephrology | Admitting: Nephrology

## 2023-09-21 ENCOUNTER — Ambulatory Visit: Payer: 59

## 2023-09-21 ENCOUNTER — Ambulatory Visit
Admission: RE | Admit: 2023-09-21 | Discharge: 2023-09-21 | Disposition: A | Discharge status: Home / Self Care | Payer: 59 | Source: Ambulatory Visit | Attending: Family Medicine | Admitting: Family Medicine

## 2023-09-21 DIAGNOSIS — R2242 Localized swelling, mass and lump, left lower limb: Secondary | ICD-10-CM | POA: Diagnosis present

## 2023-09-21 DIAGNOSIS — R1909 Other intra-abdominal and pelvic swelling, mass and lump: Secondary | ICD-10-CM

## 2023-10-02 ENCOUNTER — Ambulatory Visit
Admission: RE | Admit: 2023-10-02 | Discharge: 2023-10-02 | Disposition: A | Discharge status: Home / Self Care | Payer: 59 | Source: Ambulatory Visit | Attending: Family Medicine | Admitting: Family Medicine

## 2023-10-02 ENCOUNTER — Other Ambulatory Visit: Payer: Self-pay | Admitting: Family Medicine

## 2023-10-02 DIAGNOSIS — N838 Other noninflammatory disorders of ovary, fallopian tube and broad ligament: Secondary | ICD-10-CM | POA: Insufficient documentation

## 2023-10-02 DIAGNOSIS — R188 Other ascites: Secondary | ICD-10-CM | POA: Diagnosis present

## 2024-01-30 DEATH — deceased
